# Patient Record
Sex: Female | Born: 1970 | Race: White | Hispanic: No | Marital: Married | Smoking: Never smoker
Health system: Southern US, Community
[De-identification: ages and names within clinical notes are randomized; demographics above are authoritative.]

## PROBLEM LIST (undated history)

## (undated) DIAGNOSIS — F419 Anxiety disorder, unspecified: Secondary | ICD-10-CM

## (undated) DIAGNOSIS — G43909 Migraine, unspecified, not intractable, without status migrainosus: Secondary | ICD-10-CM

## (undated) DIAGNOSIS — K589 Irritable bowel syndrome without diarrhea: Secondary | ICD-10-CM

## (undated) HISTORY — PX: HIATAL HERNIA REPAIR: SHX195

## (undated) HISTORY — PX: ABDOMINAL HYSTERECTOMY: SHX81

## (undated) HISTORY — PX: ANKLE SURGERY: SHX546

---

## 2021-04-10 ENCOUNTER — Emergency Department (HOSPITAL_BASED_OUTPATIENT_CLINIC_OR_DEPARTMENT_OTHER): Payer: Federal, State, Local not specified - PPO

## 2021-04-10 ENCOUNTER — Encounter (HOSPITAL_BASED_OUTPATIENT_CLINIC_OR_DEPARTMENT_OTHER): Payer: Self-pay

## 2021-04-10 ENCOUNTER — Other Ambulatory Visit: Payer: Self-pay

## 2021-04-10 ENCOUNTER — Emergency Department (HOSPITAL_BASED_OUTPATIENT_CLINIC_OR_DEPARTMENT_OTHER)
Admission: EM | Admit: 2021-04-10 | Discharge: 2021-04-10 | Disposition: A | Discharge status: Home / Self Care | Payer: Federal, State, Local not specified - PPO | Attending: Emergency Medicine | Admitting: Emergency Medicine

## 2021-04-10 DIAGNOSIS — Z23 Encounter for immunization: Secondary | ICD-10-CM | POA: Diagnosis not present

## 2021-04-10 DIAGNOSIS — S50812A Abrasion of left forearm, initial encounter: Secondary | ICD-10-CM | POA: Insufficient documentation

## 2021-04-10 DIAGNOSIS — W19XXXA Unspecified fall, initial encounter: Secondary | ICD-10-CM

## 2021-04-10 DIAGNOSIS — S59912A Unspecified injury of left forearm, initial encounter: Secondary | ICD-10-CM | POA: Diagnosis present

## 2021-04-10 DIAGNOSIS — W01198A Fall on same level from slipping, tripping and stumbling with subsequent striking against other object, initial encounter: Secondary | ICD-10-CM | POA: Insufficient documentation

## 2021-04-10 DIAGNOSIS — T148XXA Other injury of unspecified body region, initial encounter: Secondary | ICD-10-CM

## 2021-04-10 DIAGNOSIS — M79602 Pain in left arm: Secondary | ICD-10-CM

## 2021-04-10 DIAGNOSIS — Y9369 Activity, other involving other sports and athletics played as a team or group: Secondary | ICD-10-CM | POA: Insufficient documentation

## 2021-04-10 HISTORY — DX: Anxiety disorder, unspecified: F41.9

## 2021-04-10 HISTORY — DX: Migraine, unspecified, not intractable, without status migrainosus: G43.909

## 2021-04-10 HISTORY — DX: Irritable bowel syndrome without diarrhea: K58.9

## 2021-04-10 MED ORDER — KETOROLAC TROMETHAMINE 60 MG/2ML IM SOLN
60.0000 mg | Freq: Once | INTRAMUSCULAR | Status: AC
  Administered 2021-04-10: 60 mg via INTRAMUSCULAR
  Filled 2021-04-10: qty 2

## 2021-04-10 MED ORDER — NAPROXEN 500 MG PO TABS: 500.0000 mg | ORAL_TABLET | Freq: Two times a day (BID) | ORAL | 0 refills | Status: AC

## 2021-04-10 MED ORDER — METHOCARBAMOL 500 MG PO TABS: 500.0000 mg | ORAL_TABLET | Freq: Two times a day (BID) | ORAL | 0 refills | Status: AC

## 2021-04-10 MED ORDER — BACITRACIN ZINC 500 UNIT/GM EX OINT
TOPICAL_OINTMENT | Freq: Once | CUTANEOUS | Status: AC
  Filled 2021-04-10: qty 28.35

## 2021-04-10 MED ORDER — TETANUS-DIPHTH-ACELL PERTUSSIS 5-2.5-18.5 LF-MCG/0.5 IM SUSY
0.5000 mL | PREFILLED_SYRINGE | Freq: Once | INTRAMUSCULAR | Status: AC
  Administered 2021-04-10: 0.5 mL via INTRAMUSCULAR
  Filled 2021-04-10: qty 0.5

## 2021-04-10 MED ORDER — HYDROCODONE-ACETAMINOPHEN 5-325 MG PO TABS
1.0000 | ORAL_TABLET | Freq: Once | ORAL | Status: AC
  Administered 2021-04-10: 1 via ORAL
  Filled 2021-04-10: qty 1

## 2021-04-10 NOTE — ED Provider Notes (Signed)
[facility] EMERGENCY DEPARTMENT Provider Note   CSN: [id number] Arrival date & time: 04/10/21  2021     History Chief Complaint  Patient presents with   Arm Injury    Regina Maldonado is a 50 y.o. female with no relevant past medical history presents the ED accompanied by her husband after falling on asphalt.  Patient reports that she was wearing sandals when she decided to raise her daughter on asphalt pavement.  Her sandal ended up catching on the ground causing her to fall forward, landing on her elbows bilaterally.  She denies hitting her head or losing consciousness.  She has abrasions to her forearms bilaterally.  She complains predominantly of pain ranging from her mid forearm to midshaft humerus.  She is keeping her left arm adducted and is reluctant to participate in physical exam.  She complains of 10 out of 10 pain.  She landed on her abdomen, as well, but denies any abdominal pain, chest pain or difficulty breathing, chest wall pain, headache, neck pain, numbness or weakness, or any other symptoms.  HPI     Past Medical History:  Diagnosis Date   Anxiety    IBS (irritable bowel syndrome)    Migraine     There are no problems to display for this patient.   Past Surgical History:  Procedure Laterality Date   ABDOMINAL HYSTERECTOMY     ANKLE SURGERY Left    HIATAL HERNIA REPAIR       OB History   No obstetric history on file.     History reviewed. No pertinent family history.  Social History   Tobacco Use   Smoking status: Never   Smokeless tobacco: Never  Vaping Use   Vaping Use: Never used  Substance Use Topics   Alcohol use: Not Currently   Drug use: Not Currently    Home Medications Prior to Admission medications   Medication Sig Start Date End Date Taking? Authorizing Provider  methocarbamol (ROBAXIN) 500 MG tablet Take 1 tablet (500 mg total) by mouth 2 (two) times daily for 10 days. 04/10/21 04/20/21 Yes Lorelee New, PA-C   naproxen (NAPROSYN) 500 MG tablet Take 1 tablet (500 mg total) by mouth 2 (two) times daily. 04/10/21  Yes Lorelee New, PA-C    Allergies    Patient has no known allergies.  Review of Systems   Review of Systems  All other systems reviewed and are negative.  Physical Exam Updated Vital Signs BP 135/87 (BP Location: Right Arm)   Pulse (!) 102   Temp 98.4 F (36.9 C) (Oral)   Resp 18   Ht 5\' 6"  (1.676 m)   Wt 92.5 kg   SpO2 97%   BMI 32.93 kg/m   Physical Exam Vitals and nursing note reviewed. Exam conducted with a chaperone present.  Constitutional:      Appearance: Normal appearance.  HENT:     Head: Normocephalic and atraumatic.  Eyes:     General: No scleral icterus.    Conjunctiva/sclera: Conjunctivae normal.  Neck:     Comments: No midline cervical tenderness to palpation.  ROM intact.  Mild left-sided trapezial region tenderness, no overlying skin changes. Cardiovascular:     Rate and Rhythm: Normal rate.     Pulses: Normal pulses.  Pulmonary:     Effort: Pulmonary effort is normal. No respiratory distress.     Breath sounds: Normal breath sounds.     Comments: No areas of chest wall tenderness to palpation.  CTA bilaterally. Abdominal:     General: Abdomen is flat. There is no distension.     Palpations: Abdomen is soft.     Tenderness: There is no abdominal tenderness.     Comments: No abrasions.  Soft, nondistended.  Nontender.  Musculoskeletal:     Cervical back: Normal range of motion. No rigidity.     Comments: Left elbow: Very limited ROM due to pain and discomfort.  TTP over olecranon.  Superficial abrasion over dorsum of forearm.  Tenderness along midshaft ulna. Left shoulder: No humeral head TTP.  Tenderness over midshaft and distal humerus.  Reluctant to participate with range of motion.  No clavicular tenderness. Left wrist: Flexion and extension intact.  No bony tenderness.  Radial pulse intact and symmetrical contralateral wrist.  Grip  strength intact.  Brisk capillary refill.  Assessed ulnar, median, and radial nerves, all intact.  Compartments are soft.  Skin:    General: Skin is dry.  Neurological:     Mental Status: She is alert.     GCS: GCS eye subscore is 4. GCS verbal subscore is 5. GCS motor subscore is 6.  Psychiatric:        Mood and Affect: Mood normal.        Behavior: Behavior normal.        Thought Content: Thought content normal.    ED Results / Procedures / Treatments   Labs (all labs ordered are listed, but only abnormal results are displayed) Labs Reviewed - No data to display  EKG None  Radiology DG Elbow Complete Left  Result Date: 04/10/2021 CLINICAL DATA:  Fall, left arm pain EXAM: LEFT ELBOW - COMPLETE 3+ VIEW COMPARISON:  None. FINDINGS: There is no evidence of fracture, dislocation, or joint effusion. There is no evidence of arthropathy or other focal bone abnormality. Soft tissues are unremarkable. IMPRESSION: Negative. Electronically Signed   By: Charlett Nose M.D.   On: 04/10/2021 21:45   DG Forearm Left  Result Date: 04/10/2021 CLINICAL DATA:  Fall, left arm pain EXAM: LEFT FOREARM - 2 VIEW COMPARISON:  None. FINDINGS: There is no evidence of fracture or other focal bone lesions. Soft tissues are unremarkable. IMPRESSION: Negative. Electronically Signed   By: Charlett Nose M.D.   On: 04/10/2021 21:46   DG Humerus Left  Result Date: 04/10/2021 CLINICAL DATA:  Fall, left arm pain EXAM: LEFT HUMERUS - 2+ VIEW COMPARISON:  None. FINDINGS: There is no evidence of fracture or other focal bone lesions. Soft tissues are unremarkable. IMPRESSION: Negative. Electronically Signed   By: Charlett Nose M.D.   On: 04/10/2021 21:46    Procedures Procedures   Medications Ordered in ED Medications  Tdap (BOOSTRIX) injection 0.5 mL (has no administration in time range)  HYDROcodone-acetaminophen (NORCO/VICODIN) 5-325 MG per tablet 1 tablet (1 tablet Oral Given 04/10/21 2124)  ketorolac (TORADOL)  injection 60 mg (60 mg Intramuscular Given 04/10/21 2124)  bacitracin ointment ( Topical Given 04/10/21 2124)    ED Course  I have reviewed the triage vital signs and the nursing notes.  Pertinent labs & imaging results that were available during my care of the patient were reviewed by me and considered in my medical decision making (see chart for details).    MDM Rules/Calculators/A&P                          Regina Maldonado was evaluated in Emergency Department on 04/10/2021 for the symptoms described in the history  of present illness. She was evaluated in the context of the global COVID-19 pandemic, which necessitated consideration that the patient might be at risk for infection with the SARS-CoV-2 virus that causes COVID-19. Institutional protocols and algorithms that pertain to the evaluation of patients at risk for COVID-19 are in a state of rapid change based on information released by regulatory bodies including the CDC and federal and state organizations. These policies and algorithms were followed during the patient's care in the ED.  I personally reviewed patient's medical chart and all notes from triage and staff during today's encounter. I have also ordered and reviewed all labs and imaging that I felt to be medically necessary in the evaluation of this patient's complaints and with consideration of their physical exam. If needed, translation services were available and utilized.   Patient in the ED with complaints of left arm pain s/p mechanical fall.  Will clean the abrasions on forearms bilaterally and apply bacitracin.  We will apply Xeroform nonstick adhesive dressing and then wrap.  Will obtain plain films of humerus, elbow, and forearm.  We will provide Vicodin and single dose Toradol IM here in the ED for pain control.  Given that patient is uncertain as to tetanus immunization status, will update here in the ED.  Plain films are personally reviewed and demonstrate no fracture or  other osseous abnormalities.  On subsequent evaluation, patient is feeling improved after Toradol and Vicodin.  She continues to endorse pain when moving her arm.  I offered sling, but she declined.  She will go home with bacitracin and instructions for wound care.  Will refer her to orthopedics in the event that her symptoms fail to improve with conservative treatment.  She can follow-up with primary care provider tomorrow.  She works for the [employer].  She states that she is right-hand dominant, but uses her left arm frequently.  We will provide work note, she can return to work in a couple days with activity as tolerated.  ER return precautions discussed.  Patient and husband voiced understanding and are agreeable to the plan.  Final Clinical Impression(s) / ED Diagnoses Final diagnoses:  Fall, initial encounter  Abrasion  Left arm pain    Rx / DC Orders ED Discharge Orders          Ordered    methocarbamol (ROBAXIN) 500 MG tablet  2 times daily        04/10/21 2219    naproxen (NAPROSYN) 500 MG tablet  2 times daily        04/10/21 2219             Lorelee New, PA-C 04/10/21 2223    Gwyneth Sprout, MD 04/12/21 [phone]

## 2021-04-10 NOTE — Discharge Instructions (Addendum)
Your imaging here in the ED is reassuring without any evidence of fracture or dislocation.  Please follow-up with your primary care provider regarding today's ED encounter and for ongoing evaluation and management.  I have also referred you to orthopedics, Dr. Eulah Pont.  Please call to schedule appointment if your symptoms fail to improve with conservative therapy.  I prescribed you naproxen, do not combine with other NSAIDs.  Take as directed.  You were given a prescription for Robaxin which is a muscle relaxer.  You should not drive, work, consume alcohol, or operate machinery while taking this medication as it can make you very drowsy.    Please continue to change the dressings regularly and apply bacitracin to help mitigate risk of infection.  Keep the wounds clean and covered.  Return to the ED or seek immediate medical attention should you experience any new or worsening symptoms.

## 2021-04-10 NOTE — ED Notes (Signed)
Patient transported to X-ray 

## 2021-04-10 NOTE — ED Triage Notes (Signed)
Pt reports playing with daughter and falling on concrete. Reports left upper arm injury and pain.

## 2022-07-13 IMAGING — DX DG FOREARM 2V*L*
2 series · 2 of 2 positions shown · non-contrast
Comparison: None.

CLINICAL DATA: Fall, left arm pain

EXAM:
LEFT FOREARM - 2 VIEW

[forearm ap]
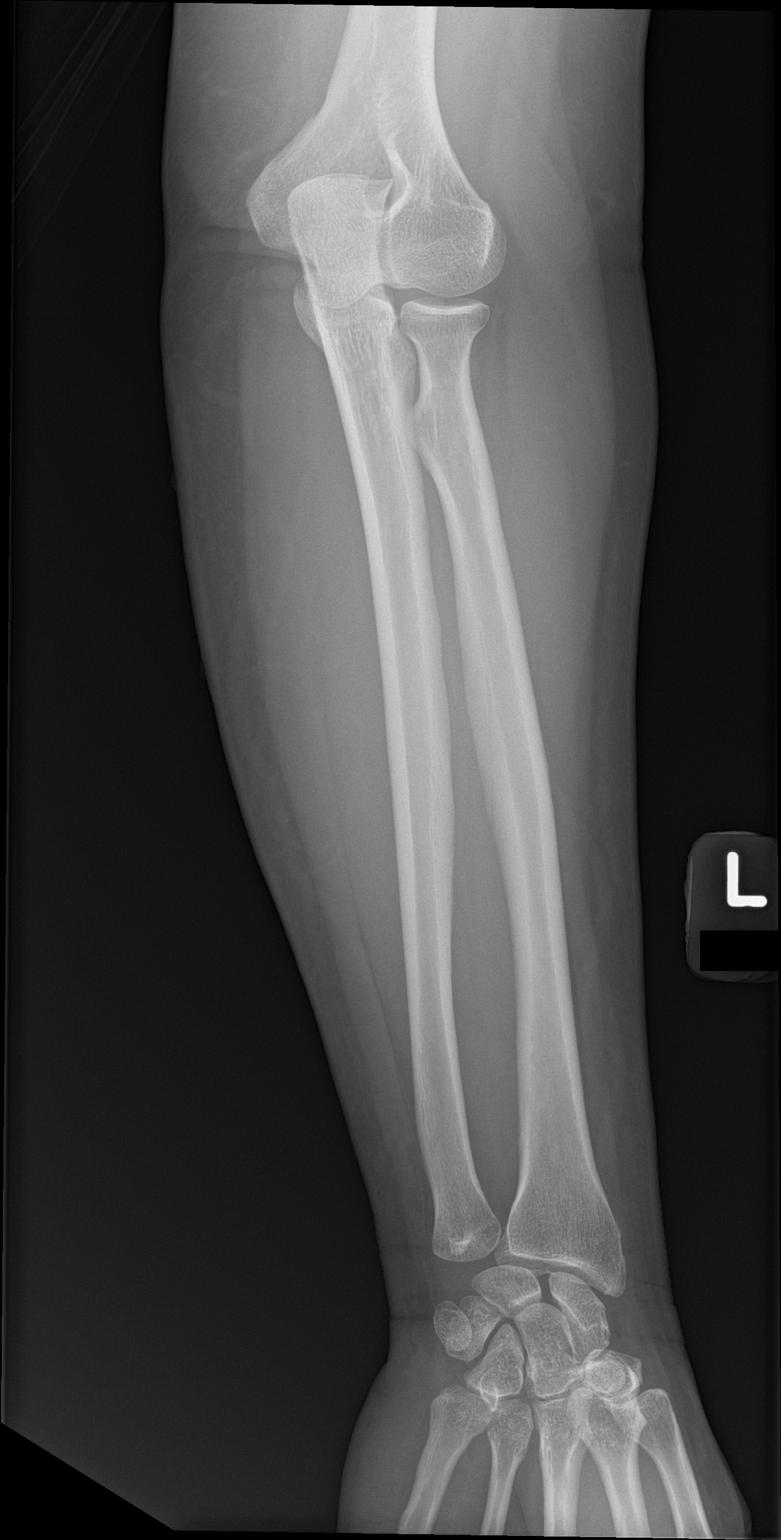

[forearm lat]
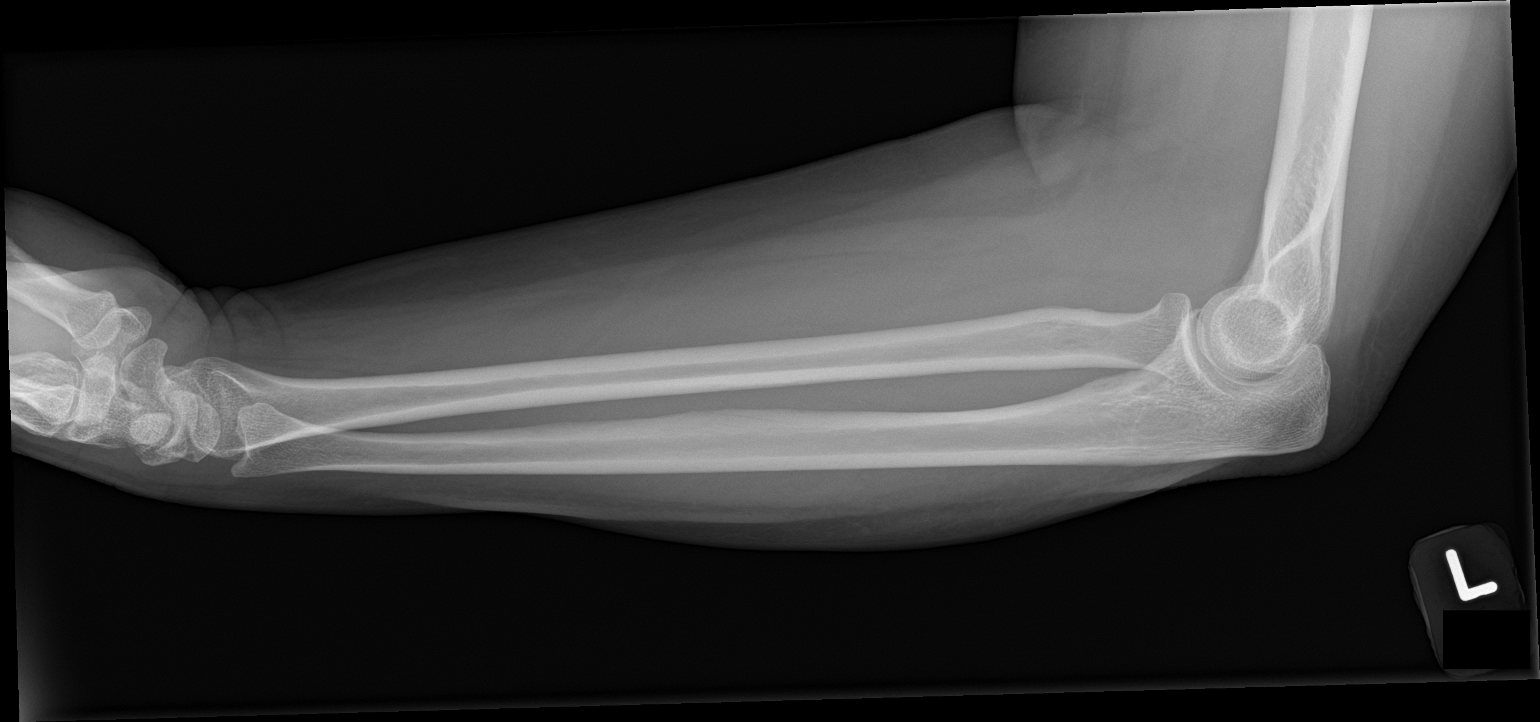

[2 of 2 positions shown; findings below may reference images not displayed]

FINDINGS: There is no evidence of fracture or other focal bone lesions. Soft
tissues are unremarkable.
IMPRESSION: Negative.
